# Patient Record
Sex: Female | Born: 1945
Health system: Southern US, Community
[De-identification: ages and names within clinical notes are randomized; demographics above are authoritative.]

## PROBLEM LIST (undated history)

## (undated) DIAGNOSIS — R413 Other amnesia: Secondary | ICD-10-CM

## (undated) DIAGNOSIS — I10 Essential (primary) hypertension: Secondary | ICD-10-CM

## (undated) DIAGNOSIS — H04123 Dry eye syndrome of bilateral lacrimal glands: Secondary | ICD-10-CM

## (undated) DIAGNOSIS — B0052 Herpesviral keratitis: Secondary | ICD-10-CM

## (undated) DIAGNOSIS — M8580 Other specified disorders of bone density and structure, unspecified site: Secondary | ICD-10-CM

## (undated) DIAGNOSIS — G5760 Lesion of plantar nerve, unspecified lower limb: Secondary | ICD-10-CM

## (undated) DIAGNOSIS — M1990 Unspecified osteoarthritis, unspecified site: Secondary | ICD-10-CM

## (undated) DIAGNOSIS — J302 Other seasonal allergic rhinitis: Secondary | ICD-10-CM

## (undated) DIAGNOSIS — M6740 Ganglion, unspecified site: Secondary | ICD-10-CM

## (undated) DIAGNOSIS — M5136 Other intervertebral disc degeneration, lumbar region: Secondary | ICD-10-CM

## (undated) DIAGNOSIS — G250 Essential tremor: Secondary | ICD-10-CM

## (undated) HISTORY — DX: Ganglion, unspecified site: M67.40

## (undated) HISTORY — DX: Herpesviral keratitis: B00.52

## (undated) HISTORY — DX: Other specified disorders of bone density and structure, unspecified site: M85.80

## (undated) HISTORY — DX: Essential tremor: G25.0

## (undated) HISTORY — DX: Other seasonal allergic rhinitis: J30.2

## (undated) HISTORY — DX: Essential (primary) hypertension: I10

## (undated) HISTORY — DX: Other amnesia: R41.3

## (undated) HISTORY — PX: OTHER SURGICAL HISTORY: SHX169

## (undated) HISTORY — DX: Unspecified osteoarthritis, unspecified site: M19.90

## (undated) HISTORY — DX: Lesion of plantar nerve, unspecified lower limb: G57.60

## (undated) HISTORY — DX: Other intervertebral disc degeneration, lumbar region: M51.36

## (undated) HISTORY — DX: Dry eye syndrome of bilateral lacrimal glands: H04.123

---

## 2013-01-25 ENCOUNTER — Other Ambulatory Visit: Payer: Self-pay

## 2013-01-25 DIAGNOSIS — Z1231 Encounter for screening mammogram for malignant neoplasm of breast: Secondary | ICD-10-CM

## 2013-02-14 ENCOUNTER — Other Ambulatory Visit: Payer: Self-pay | Admitting: Obstetrics and Gynecology

## 2013-02-14 ENCOUNTER — Other Ambulatory Visit (HOSPITAL_COMMUNITY)
Admission: RE | Admit: 2013-02-14 | Discharge: 2013-02-14 | Disposition: A | Discharge status: Home / Self Care | Payer: Managed Care, Other (non HMO) | Source: Ambulatory Visit | Attending: Obstetrics and Gynecology | Admitting: Obstetrics and Gynecology

## 2013-02-14 DIAGNOSIS — Z01419 Encounter for gynecological examination (general) (routine) without abnormal findings: Secondary | ICD-10-CM | POA: Insufficient documentation

## 2013-02-14 DIAGNOSIS — Z1151 Encounter for screening for human papillomavirus (HPV): Secondary | ICD-10-CM | POA: Insufficient documentation

## 2013-03-03 ENCOUNTER — Ambulatory Visit
Admission: RE | Admit: 2013-03-03 | Discharge: 2013-03-03 | Disposition: A | Discharge status: Home / Self Care | Payer: Managed Care, Other (non HMO) | Source: Ambulatory Visit

## 2013-03-03 DIAGNOSIS — Z1231 Encounter for screening mammogram for malignant neoplasm of breast: Secondary | ICD-10-CM

## 2014-01-26 ENCOUNTER — Other Ambulatory Visit: Payer: Self-pay

## 2014-01-26 DIAGNOSIS — Z1231 Encounter for screening mammogram for malignant neoplasm of breast: Secondary | ICD-10-CM

## 2014-03-07 ENCOUNTER — Ambulatory Visit: Payer: Managed Care, Other (non HMO)

## 2014-03-14 ENCOUNTER — Ambulatory Visit
Admission: RE | Admit: 2014-03-14 | Discharge: 2014-03-14 | Disposition: A | Discharge status: Home / Self Care | Payer: Medicare Other | Source: Ambulatory Visit

## 2014-03-14 ENCOUNTER — Encounter (INDEPENDENT_AMBULATORY_CARE_PROVIDER_SITE_OTHER): Payer: Self-pay

## 2014-03-14 DIAGNOSIS — Z1231 Encounter for screening mammogram for malignant neoplasm of breast: Secondary | ICD-10-CM

## 2015-02-15 ENCOUNTER — Other Ambulatory Visit: Payer: Self-pay

## 2015-02-15 DIAGNOSIS — Z1231 Encounter for screening mammogram for malignant neoplasm of breast: Secondary | ICD-10-CM

## 2015-03-20 ENCOUNTER — Ambulatory Visit
Admission: RE | Admit: 2015-03-20 | Discharge: 2015-03-20 | Disposition: A | Discharge status: Home / Self Care | Payer: Medicare Other | Source: Ambulatory Visit

## 2015-03-20 DIAGNOSIS — Z1231 Encounter for screening mammogram for malignant neoplasm of breast: Secondary | ICD-10-CM

## 2016-02-19 DIAGNOSIS — Z0000 Encounter for general adult medical examination without abnormal findings: Secondary | ICD-10-CM | POA: Diagnosis not present

## 2016-02-19 DIAGNOSIS — M67431 Ganglion, right wrist: Secondary | ICD-10-CM | POA: Diagnosis not present

## 2016-02-19 DIAGNOSIS — Z1389 Encounter for screening for other disorder: Secondary | ICD-10-CM | POA: Diagnosis not present

## 2016-02-19 DIAGNOSIS — R202 Paresthesia of skin: Secondary | ICD-10-CM | POA: Diagnosis not present

## 2016-02-19 DIAGNOSIS — I10 Essential (primary) hypertension: Secondary | ICD-10-CM | POA: Diagnosis not present

## 2016-02-21 ENCOUNTER — Other Ambulatory Visit: Payer: Self-pay | Admitting: Obstetrics and Gynecology

## 2016-02-21 ENCOUNTER — Other Ambulatory Visit (HOSPITAL_COMMUNITY)
Admission: RE | Admit: 2016-02-21 | Discharge: 2016-02-21 | Disposition: A | Discharge status: Home / Self Care | Payer: Medicare Other | Source: Ambulatory Visit | Attending: Obstetrics and Gynecology | Admitting: Obstetrics and Gynecology

## 2016-02-21 DIAGNOSIS — Z1151 Encounter for screening for human papillomavirus (HPV): Secondary | ICD-10-CM | POA: Diagnosis not present

## 2016-02-21 DIAGNOSIS — Z01411 Encounter for gynecological examination (general) (routine) with abnormal findings: Secondary | ICD-10-CM | POA: Diagnosis not present

## 2016-02-21 DIAGNOSIS — Z01419 Encounter for gynecological examination (general) (routine) without abnormal findings: Secondary | ICD-10-CM | POA: Diagnosis not present

## 2016-02-22 LAB — CYTOLOGY - PAP

## 2016-02-25 ENCOUNTER — Other Ambulatory Visit: Payer: Self-pay

## 2016-02-25 DIAGNOSIS — Z1231 Encounter for screening mammogram for malignant neoplasm of breast: Secondary | ICD-10-CM

## 2016-03-20 ENCOUNTER — Ambulatory Visit
Admission: RE | Admit: 2016-03-20 | Discharge: 2016-03-20 | Disposition: A | Discharge status: Home / Self Care | Payer: Medicare Other | Source: Ambulatory Visit

## 2016-03-20 DIAGNOSIS — Z1231 Encounter for screening mammogram for malignant neoplasm of breast: Secondary | ICD-10-CM | POA: Diagnosis not present

## 2016-07-07 DIAGNOSIS — H524 Presbyopia: Secondary | ICD-10-CM | POA: Diagnosis not present

## 2016-08-21 DIAGNOSIS — R2231 Localized swelling, mass and lump, right upper limb: Secondary | ICD-10-CM | POA: Diagnosis not present

## 2016-08-21 DIAGNOSIS — Z23 Encounter for immunization: Secondary | ICD-10-CM | POA: Diagnosis not present

## 2016-08-21 DIAGNOSIS — I10 Essential (primary) hypertension: Secondary | ICD-10-CM | POA: Diagnosis not present

## 2016-12-04 DIAGNOSIS — J0100 Acute maxillary sinusitis, unspecified: Secondary | ICD-10-CM | POA: Diagnosis not present

## 2016-12-25 DIAGNOSIS — L603 Nail dystrophy: Secondary | ICD-10-CM | POA: Diagnosis not present

## 2016-12-25 DIAGNOSIS — L570 Actinic keratosis: Secondary | ICD-10-CM | POA: Diagnosis not present

## 2016-12-25 DIAGNOSIS — L821 Other seborrheic keratosis: Secondary | ICD-10-CM | POA: Diagnosis not present

## 2016-12-25 DIAGNOSIS — D2260 Melanocytic nevi of unspecified upper limb, including shoulder: Secondary | ICD-10-CM | POA: Diagnosis not present

## 2016-12-25 DIAGNOSIS — D225 Melanocytic nevi of trunk: Secondary | ICD-10-CM | POA: Diagnosis not present

## 2016-12-25 DIAGNOSIS — Z85828 Personal history of other malignant neoplasm of skin: Secondary | ICD-10-CM | POA: Diagnosis not present

## 2016-12-25 DIAGNOSIS — D2270 Melanocytic nevi of unspecified lower limb, including hip: Secondary | ICD-10-CM | POA: Diagnosis not present

## 2017-01-01 DIAGNOSIS — M67431 Ganglion, right wrist: Secondary | ICD-10-CM | POA: Diagnosis not present

## 2017-01-05 DIAGNOSIS — H04123 Dry eye syndrome of bilateral lacrimal glands: Secondary | ICD-10-CM | POA: Diagnosis not present

## 2017-01-05 DIAGNOSIS — H16103 Unspecified superficial keratitis, bilateral: Secondary | ICD-10-CM | POA: Diagnosis not present

## 2017-01-05 DIAGNOSIS — H25813 Combined forms of age-related cataract, bilateral: Secondary | ICD-10-CM | POA: Diagnosis not present

## 2017-01-06 DIAGNOSIS — Z1211 Encounter for screening for malignant neoplasm of colon: Secondary | ICD-10-CM | POA: Diagnosis not present

## 2017-02-19 DIAGNOSIS — Z0000 Encounter for general adult medical examination without abnormal findings: Secondary | ICD-10-CM | POA: Diagnosis not present

## 2017-02-19 DIAGNOSIS — M67431 Ganglion, right wrist: Secondary | ICD-10-CM | POA: Diagnosis not present

## 2017-02-19 DIAGNOSIS — G250 Essential tremor: Secondary | ICD-10-CM | POA: Diagnosis not present

## 2017-02-19 DIAGNOSIS — I10 Essential (primary) hypertension: Secondary | ICD-10-CM | POA: Diagnosis not present

## 2017-03-04 ENCOUNTER — Other Ambulatory Visit: Payer: Self-pay | Admitting: Obstetrics and Gynecology

## 2017-03-04 DIAGNOSIS — Z1231 Encounter for screening mammogram for malignant neoplasm of breast: Secondary | ICD-10-CM

## 2017-03-11 DIAGNOSIS — N951 Menopausal and female climacteric states: Secondary | ICD-10-CM | POA: Diagnosis not present

## 2017-03-11 DIAGNOSIS — N898 Other specified noninflammatory disorders of vagina: Secondary | ICD-10-CM | POA: Diagnosis not present

## 2017-03-11 DIAGNOSIS — Z87410 Personal history of cervical dysplasia: Secondary | ICD-10-CM | POA: Diagnosis not present

## 2017-03-11 DIAGNOSIS — Z9189 Other specified personal risk factors, not elsewhere classified: Secondary | ICD-10-CM | POA: Diagnosis not present

## 2017-03-26 ENCOUNTER — Ambulatory Visit
Admission: RE | Admit: 2017-03-26 | Discharge: 2017-03-26 | Disposition: A | Discharge status: Home / Self Care | Payer: Medicare Other | Source: Ambulatory Visit | Attending: Obstetrics and Gynecology | Admitting: Obstetrics and Gynecology

## 2017-03-26 DIAGNOSIS — Z1231 Encounter for screening mammogram for malignant neoplasm of breast: Secondary | ICD-10-CM | POA: Diagnosis not present

## 2017-06-26 DIAGNOSIS — H04123 Dry eye syndrome of bilateral lacrimal glands: Secondary | ICD-10-CM | POA: Diagnosis not present

## 2017-06-26 DIAGNOSIS — H25813 Combined forms of age-related cataract, bilateral: Secondary | ICD-10-CM | POA: Diagnosis not present

## 2017-06-26 DIAGNOSIS — H16103 Unspecified superficial keratitis, bilateral: Secondary | ICD-10-CM | POA: Diagnosis not present

## 2017-10-09 DIAGNOSIS — Z23 Encounter for immunization: Secondary | ICD-10-CM | POA: Diagnosis not present

## 2017-12-29 DIAGNOSIS — H43812 Vitreous degeneration, left eye: Secondary | ICD-10-CM | POA: Diagnosis not present

## 2017-12-29 DIAGNOSIS — H25042 Posterior subcapsular polar age-related cataract, left eye: Secondary | ICD-10-CM | POA: Diagnosis not present

## 2018-02-10 DIAGNOSIS — H43813 Vitreous degeneration, bilateral: Secondary | ICD-10-CM | POA: Diagnosis not present

## 2018-02-10 DIAGNOSIS — H5704 Mydriasis: Secondary | ICD-10-CM | POA: Diagnosis not present

## 2018-02-10 DIAGNOSIS — H04123 Dry eye syndrome of bilateral lacrimal glands: Secondary | ICD-10-CM | POA: Diagnosis not present

## 2018-02-10 DIAGNOSIS — H25813 Combined forms of age-related cataract, bilateral: Secondary | ICD-10-CM | POA: Diagnosis not present

## 2018-02-19 DIAGNOSIS — Z1389 Encounter for screening for other disorder: Secondary | ICD-10-CM | POA: Diagnosis not present

## 2018-02-19 DIAGNOSIS — M8580 Other specified disorders of bone density and structure, unspecified site: Secondary | ICD-10-CM | POA: Diagnosis not present

## 2018-02-19 DIAGNOSIS — G250 Essential tremor: Secondary | ICD-10-CM | POA: Diagnosis not present

## 2018-02-19 DIAGNOSIS — I10 Essential (primary) hypertension: Secondary | ICD-10-CM | POA: Diagnosis not present

## 2018-03-02 DIAGNOSIS — H21562 Pupillary abnormality, left eye: Secondary | ICD-10-CM | POA: Diagnosis not present

## 2018-03-02 DIAGNOSIS — H268 Other specified cataract: Secondary | ICD-10-CM | POA: Diagnosis not present

## 2018-03-02 DIAGNOSIS — H25812 Combined forms of age-related cataract, left eye: Secondary | ICD-10-CM | POA: Diagnosis not present

## 2018-03-08 DIAGNOSIS — C44729 Squamous cell carcinoma of skin of left lower limb, including hip: Secondary | ICD-10-CM | POA: Diagnosis not present

## 2018-03-08 DIAGNOSIS — L821 Other seborrheic keratosis: Secondary | ICD-10-CM | POA: Diagnosis not present

## 2018-03-15 DIAGNOSIS — M810 Age-related osteoporosis without current pathological fracture: Secondary | ICD-10-CM | POA: Diagnosis not present

## 2018-03-15 DIAGNOSIS — Z01411 Encounter for gynecological examination (general) (routine) with abnormal findings: Secondary | ICD-10-CM | POA: Diagnosis not present

## 2018-06-29 DIAGNOSIS — H5704 Mydriasis: Secondary | ICD-10-CM | POA: Diagnosis not present

## 2018-06-29 DIAGNOSIS — H25811 Combined forms of age-related cataract, right eye: Secondary | ICD-10-CM | POA: Diagnosis not present

## 2018-08-04 DIAGNOSIS — Z961 Presence of intraocular lens: Secondary | ICD-10-CM | POA: Diagnosis not present

## 2018-09-01 DIAGNOSIS — Z23 Encounter for immunization: Secondary | ICD-10-CM | POA: Diagnosis not present

## 2018-09-01 DIAGNOSIS — R739 Hyperglycemia, unspecified: Secondary | ICD-10-CM | POA: Diagnosis not present

## 2019-02-02 DIAGNOSIS — Z961 Presence of intraocular lens: Secondary | ICD-10-CM | POA: Diagnosis not present

## 2019-02-02 DIAGNOSIS — H3500 Unspecified background retinopathy: Secondary | ICD-10-CM | POA: Diagnosis not present

## 2019-03-02 DIAGNOSIS — Z0000 Encounter for general adult medical examination without abnormal findings: Secondary | ICD-10-CM | POA: Diagnosis not present

## 2019-03-02 DIAGNOSIS — I10 Essential (primary) hypertension: Secondary | ICD-10-CM | POA: Diagnosis not present

## 2019-03-02 DIAGNOSIS — Z1389 Encounter for screening for other disorder: Secondary | ICD-10-CM | POA: Diagnosis not present

## 2019-09-16 DIAGNOSIS — G250 Essential tremor: Secondary | ICD-10-CM | POA: Diagnosis not present

## 2019-09-16 DIAGNOSIS — Z5181 Encounter for therapeutic drug level monitoring: Secondary | ICD-10-CM | POA: Diagnosis not present

## 2019-09-16 DIAGNOSIS — I10 Essential (primary) hypertension: Secondary | ICD-10-CM | POA: Diagnosis not present

## 2019-09-26 DIAGNOSIS — Z5181 Encounter for therapeutic drug level monitoring: Secondary | ICD-10-CM | POA: Diagnosis not present

## 2019-10-02 DIAGNOSIS — L03114 Cellulitis of left upper limb: Secondary | ICD-10-CM | POA: Diagnosis not present

## 2019-10-13 DIAGNOSIS — S6992XD Unspecified injury of left wrist, hand and finger(s), subsequent encounter: Secondary | ICD-10-CM | POA: Diagnosis not present

## 2019-10-26 ENCOUNTER — Other Ambulatory Visit: Payer: Self-pay | Admitting: Internal Medicine

## 2019-10-26 DIAGNOSIS — Z1231 Encounter for screening mammogram for malignant neoplasm of breast: Secondary | ICD-10-CM

## 2020-01-08 ENCOUNTER — Ambulatory Visit: Payer: Medicare Other

## 2020-07-20 ENCOUNTER — Other Ambulatory Visit: Payer: Self-pay | Admitting: Internal Medicine

## 2020-07-20 DIAGNOSIS — R413 Other amnesia: Secondary | ICD-10-CM | POA: Diagnosis not present

## 2020-08-09 DIAGNOSIS — S8001XA Contusion of right knee, initial encounter: Secondary | ICD-10-CM | POA: Diagnosis not present

## 2020-08-11 ENCOUNTER — Ambulatory Visit
Admission: RE | Admit: 2020-08-11 | Discharge: 2020-08-11 | Disposition: A | Discharge status: Home / Self Care | Payer: Medicare Other | Source: Ambulatory Visit | Attending: Internal Medicine | Admitting: Internal Medicine

## 2020-08-11 ENCOUNTER — Other Ambulatory Visit: Payer: Self-pay

## 2020-08-11 DIAGNOSIS — R413 Other amnesia: Secondary | ICD-10-CM | POA: Diagnosis not present

## 2020-08-11 DIAGNOSIS — R609 Edema, unspecified: Secondary | ICD-10-CM | POA: Diagnosis not present

## 2020-08-11 DIAGNOSIS — G930 Cerebral cysts: Secondary | ICD-10-CM | POA: Diagnosis not present

## 2020-08-11 DIAGNOSIS — I6782 Cerebral ischemia: Secondary | ICD-10-CM | POA: Diagnosis not present

## 2020-08-11 MED ORDER — GADOBENATE DIMEGLUMINE 529 MG/ML IV SOLN
13.0000 mL | Freq: Once | INTRAVENOUS | Status: AC | PRN
Start: 2020-08-11 — End: 2020-08-11
  Administered 2020-08-11: 13 mL via INTRAVENOUS

## 2020-09-18 DIAGNOSIS — E538 Deficiency of other specified B group vitamins: Secondary | ICD-10-CM | POA: Diagnosis not present

## 2020-11-08 ENCOUNTER — Ambulatory Visit: Payer: Medicare Other | Admitting: Neurology

## 2020-12-25 ENCOUNTER — Ambulatory Visit: Payer: Medicare Other | Admitting: Neurology

## 2020-12-27 DIAGNOSIS — I10 Essential (primary) hypertension: Secondary | ICD-10-CM | POA: Diagnosis not present

## 2020-12-27 DIAGNOSIS — M5432 Sciatica, left side: Secondary | ICD-10-CM | POA: Diagnosis not present

## 2021-01-03 ENCOUNTER — Ambulatory Visit (HOSPITAL_COMMUNITY)
Admission: RE | Admit: 2021-01-03 | Discharge: 2021-01-03 | Disposition: A | Discharge status: Home / Self Care | Payer: Medicare Other | Source: Ambulatory Visit | Attending: Internal Medicine | Admitting: Internal Medicine

## 2021-01-03 ENCOUNTER — Other Ambulatory Visit: Payer: Self-pay

## 2021-01-03 ENCOUNTER — Other Ambulatory Visit (HOSPITAL_COMMUNITY): Payer: Self-pay | Admitting: Internal Medicine

## 2021-01-03 ENCOUNTER — Other Ambulatory Visit: Payer: Self-pay | Admitting: Internal Medicine

## 2021-01-03 DIAGNOSIS — M4316 Spondylolisthesis, lumbar region: Secondary | ICD-10-CM | POA: Diagnosis not present

## 2021-01-03 DIAGNOSIS — M48061 Spinal stenosis, lumbar region without neurogenic claudication: Secondary | ICD-10-CM | POA: Diagnosis not present

## 2021-01-03 DIAGNOSIS — M5432 Sciatica, left side: Secondary | ICD-10-CM | POA: Diagnosis not present

## 2021-01-03 DIAGNOSIS — M47816 Spondylosis without myelopathy or radiculopathy, lumbar region: Secondary | ICD-10-CM | POA: Diagnosis not present

## 2021-01-03 DIAGNOSIS — M5126 Other intervertebral disc displacement, lumbar region: Secondary | ICD-10-CM | POA: Diagnosis not present

## 2021-01-07 ENCOUNTER — Other Ambulatory Visit: Payer: Self-pay | Admitting: Physician Assistant

## 2021-01-07 DIAGNOSIS — M5416 Radiculopathy, lumbar region: Secondary | ICD-10-CM | POA: Diagnosis not present

## 2021-01-11 ENCOUNTER — Ambulatory Visit
Admission: RE | Admit: 2021-01-11 | Discharge: 2021-01-11 | Disposition: A | Discharge status: Home / Self Care | Payer: Medicare Other | Source: Ambulatory Visit | Attending: Physician Assistant | Admitting: Physician Assistant

## 2021-01-11 DIAGNOSIS — M5416 Radiculopathy, lumbar region: Secondary | ICD-10-CM

## 2021-01-11 DIAGNOSIS — M47817 Spondylosis without myelopathy or radiculopathy, lumbosacral region: Secondary | ICD-10-CM | POA: Diagnosis not present

## 2021-01-11 MED ORDER — METHYLPREDNISOLONE ACETATE 40 MG/ML INJ SUSP (RADIOLOG
120.0000 mg | Freq: Once | INTRAMUSCULAR | Status: AC
  Administered 2021-01-11: 120 mg via EPIDURAL

## 2021-01-11 MED ORDER — IOPAMIDOL (ISOVUE-M 200) INJECTION 41%
1.0000 mL | Freq: Once | INTRAMUSCULAR | Status: AC
  Administered 2021-01-11: 1 mL via EPIDURAL

## 2021-01-11 NOTE — Discharge Instructions (Signed)

## 2021-01-16 ENCOUNTER — Other Ambulatory Visit: Payer: Self-pay | Admitting: Physician Assistant

## 2021-01-16 DIAGNOSIS — M5416 Radiculopathy, lumbar region: Secondary | ICD-10-CM

## 2021-01-21 DIAGNOSIS — M5416 Radiculopathy, lumbar region: Secondary | ICD-10-CM | POA: Diagnosis not present

## 2021-01-25 ENCOUNTER — Ambulatory Visit
Admission: RE | Admit: 2021-01-25 | Discharge: 2021-01-25 | Disposition: A | Discharge status: Home / Self Care | Payer: Medicare Other | Source: Ambulatory Visit | Attending: Physician Assistant | Admitting: Physician Assistant

## 2021-01-25 DIAGNOSIS — M5416 Radiculopathy, lumbar region: Secondary | ICD-10-CM

## 2021-01-25 DIAGNOSIS — M47817 Spondylosis without myelopathy or radiculopathy, lumbosacral region: Secondary | ICD-10-CM | POA: Diagnosis not present

## 2021-01-25 MED ORDER — IOPAMIDOL (ISOVUE-M 200) INJECTION 41%
1.0000 mL | Freq: Once | INTRAMUSCULAR | Status: AC
  Administered 2021-01-25: 1 mL via EPIDURAL

## 2021-01-25 MED ORDER — METHYLPREDNISOLONE ACETATE 40 MG/ML INJ SUSP (RADIOLOG
120.0000 mg | Freq: Once | INTRAMUSCULAR | Status: AC
  Administered 2021-01-25: 120 mg via EPIDURAL

## 2021-01-25 NOTE — Discharge Instructions (Signed)

## 2021-02-13 DIAGNOSIS — M5416 Radiculopathy, lumbar region: Secondary | ICD-10-CM | POA: Diagnosis not present

## 2021-02-14 ENCOUNTER — Other Ambulatory Visit: Payer: Self-pay | Admitting: Neurosurgery

## 2021-02-14 DIAGNOSIS — M5416 Radiculopathy, lumbar region: Secondary | ICD-10-CM

## 2021-02-15 ENCOUNTER — Other Ambulatory Visit: Payer: Self-pay

## 2021-02-15 ENCOUNTER — Ambulatory Visit
Admission: RE | Admit: 2021-02-15 | Discharge: 2021-02-15 | Disposition: A | Discharge status: Home / Self Care | Payer: Medicare Other | Source: Ambulatory Visit | Attending: Neurosurgery | Admitting: Neurosurgery

## 2021-02-15 DIAGNOSIS — M5416 Radiculopathy, lumbar region: Secondary | ICD-10-CM

## 2021-02-15 DIAGNOSIS — M5116 Intervertebral disc disorders with radiculopathy, lumbar region: Secondary | ICD-10-CM | POA: Diagnosis not present

## 2021-02-15 IMAGING — XA DG EPIDURAL NERVE ROOT
2 series · 2 of 2 positions shown · non-contrast
Comparison: none

CLINICAL DATA: Lumbar radiculopathy. Displacement the L3-4 and L4-5
lumbar discs.

[Series 1: ortho standard · 1 of 1 slices shown (1 of 2)]
[im 1/1]
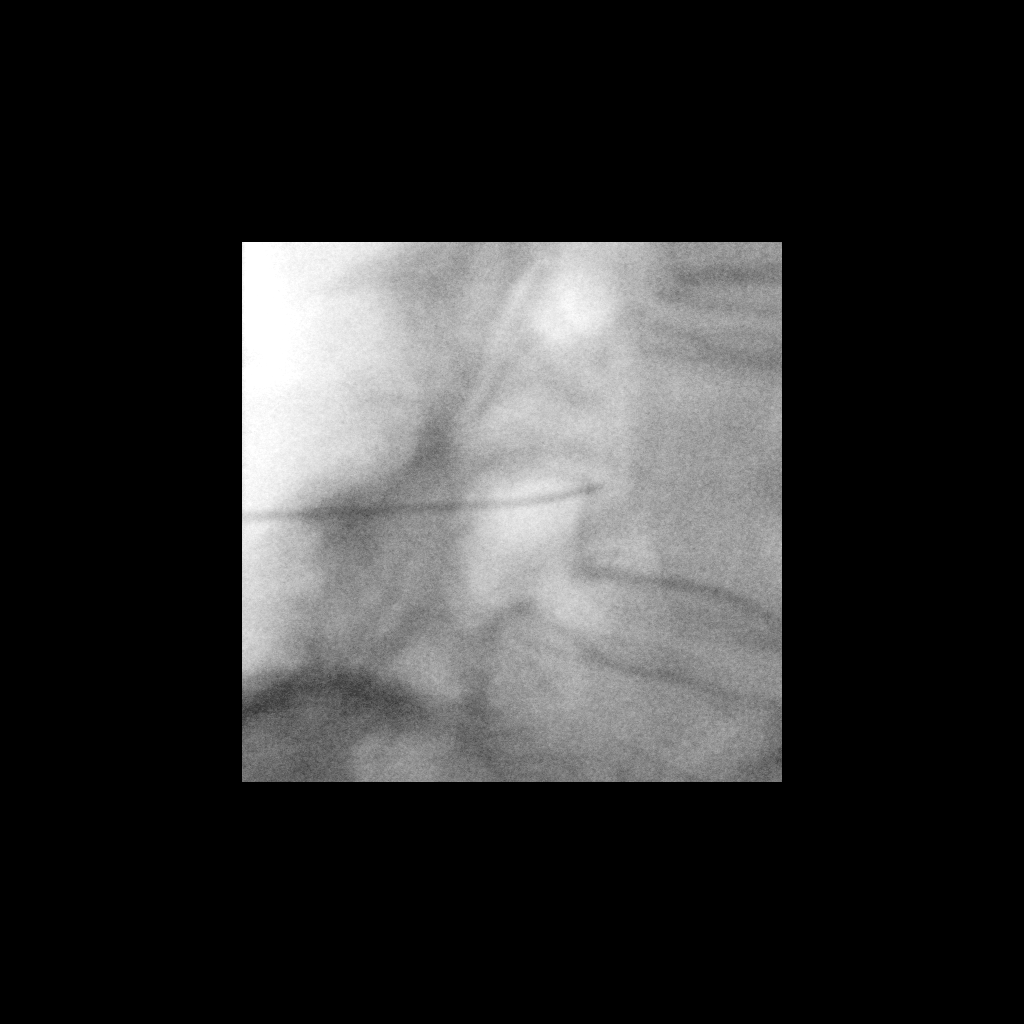

[Series 2: ortho standard · 1 of 1 slices shown (2 of 2)]
[im 1/1]
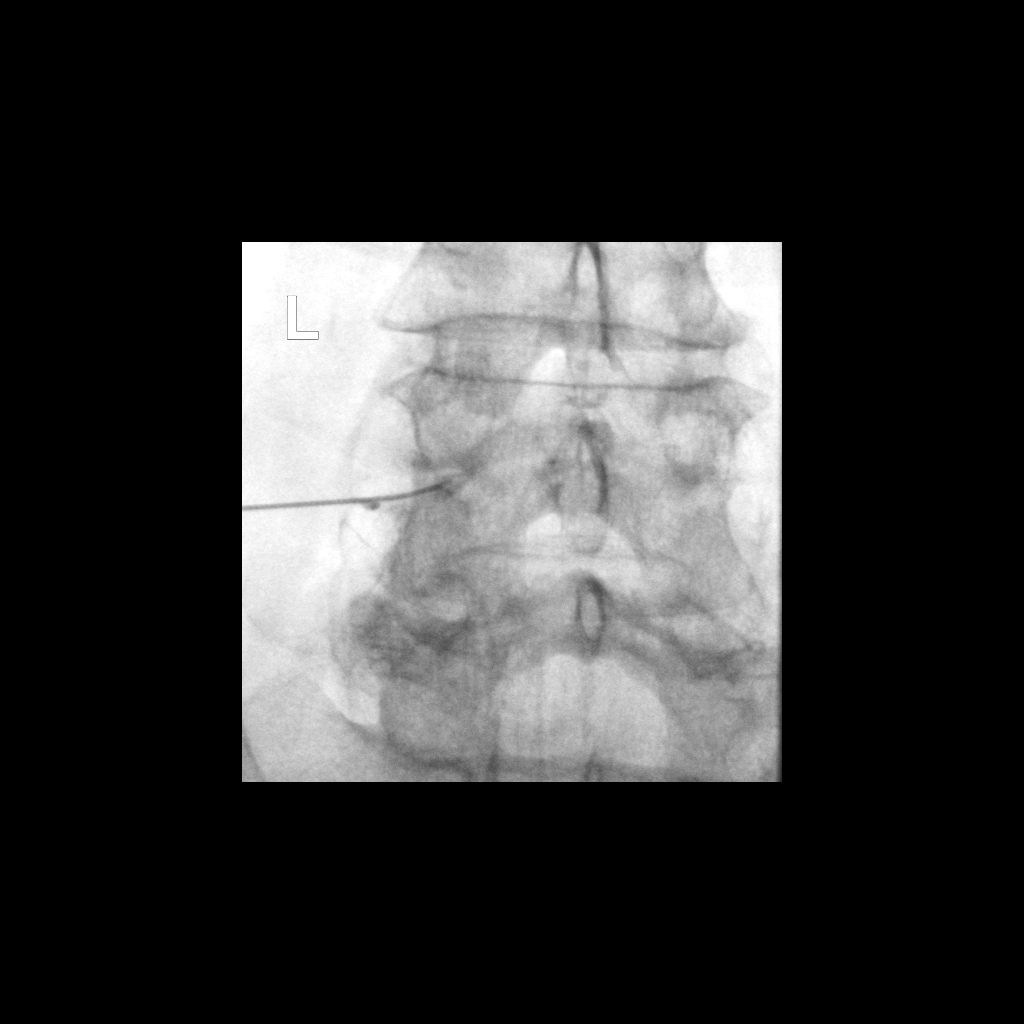

[2 of 2 positions shown; findings below may reference images not displayed]

FLUOROSCOPY TIME:  Radiation Exposure Index (as provided by the
fluoroscopic device): 14.91 uGy*m2

PROCEDURE:
SELECTIVE NERVE ROOT AND TRANSFORAMINAL EPIDURAL STEROID INJECTION:

A transforaminal approach was performed on the left at the L4-5
foramen. The overlying skin was cleansed and anesthetized. A 22
gauge spinal needle was advanced into the foramen from a lateral
oblique approach. Injection of 2cc of Omnipaque 180 outlined the
nerve root and extended into the epidural space. No vascular or
intrathecal spread is evident.

I then injected 120 mg of Depo-Medrol and 1.5 ml of 1% lidocaine.
The patient tolerated the procedure without evidence for
complication.

The patient was observed for 20 minutes prior to discharge in stable
neurologic condition.
IMPRESSION: Technically successful selective nerve root block and transforaminal
epidural steroid injection on theleft at the L4-5 foramen.

## 2021-02-15 MED ORDER — METHYLPREDNISOLONE ACETATE 40 MG/ML INJ SUSP (RADIOLOG
120.0000 mg | Freq: Once | INTRAMUSCULAR | Status: AC
  Administered 2021-02-15: 120 mg via EPIDURAL

## 2021-02-15 MED ORDER — IOPAMIDOL (ISOVUE-M 200) INJECTION 41%
1.0000 mL | Freq: Once | INTRAMUSCULAR | Status: AC
  Administered 2021-02-15: 1 mL via EPIDURAL

## 2021-02-15 NOTE — Discharge Instructions (Signed)

## 2021-05-02 ENCOUNTER — Encounter: Payer: Self-pay | Admitting: *Deleted

## 2021-05-07 ENCOUNTER — Ambulatory Visit: Payer: Medicare Other | Admitting: Neurology

## 2021-05-07 ENCOUNTER — Encounter: Payer: Self-pay | Admitting: Neurology

## 2021-05-07 VITALS — BP 112/73 | HR 73 | Ht 63.5 in | Wt 135.8 lb

## 2021-05-07 DIAGNOSIS — I679 Cerebrovascular disease, unspecified: Secondary | ICD-10-CM | POA: Insufficient documentation

## 2021-05-07 DIAGNOSIS — R4189 Other symptoms and signs involving cognitive functions and awareness: Secondary | ICD-10-CM | POA: Diagnosis not present

## 2021-05-07 MED ORDER — MEMANTINE HCL 10 MG PO TABS: 10.0000 mg | ORAL_TABLET | Freq: Two times a day (BID) | ORAL | 11 refills | Status: DC

## 2021-05-07 MED ORDER — DONEPEZIL HCL 10 MG PO TABS: 10.0000 mg | ORAL_TABLET | Freq: Every day | ORAL | 11 refills | Status: DC

## 2021-05-07 NOTE — Progress Notes (Signed)
Chief Complaint  Patient presents with   New Patient (Initial Visit)    MOCA: 21/30. She is here with her husband, Leonette Most. She has noticed a slow decline of memory (short-term more so than long-term). Hx of heavy alcohol use (now only have one glass of wine with dinner).      ASSESSMENT AND PLAN  Mary Arias is a 75 y.o. female   Cognitive impairment  MoCA examination 21/30  MRI of the brain September 2021 showed no acute abnormality, mild generalized atrophy, supratentorium small vessel disease  Laboratory evaluation in August 2021 showed no treatable etiology other than low normal range B12 224,  Mother, maternal grandfather suffered dementia,  Most consistent with central nervous system degenerative disorder, likely early stage of Alzheimer's disease  Emphasized importance of exercise, healthy lifestyle,  After discussed with patient and her husband, decided to proceed with Namenda 10 mg twice a day, 1 month later will add on Aricept 10 mg daily Cerebral small vessel disease  She has vascular risk factor of aging, hypertension, excessive alcohol intake  DIAGNOSTIC DATA (LABS, IMAGING, TESTING) - I reviewed patient records, labs, notes, testing and imaging myself where available.  Personally Reviewed MRI of brain w/wo on Sept 18 2021 No acute abnormality   Age-appropriate atrophy with mild chronic microvascular ischemic change in the white matter. 17 mm arachnoid cyst over the right convexity.   Lab result in August 2021:  TSH 2.48, CMP, creat 0.86, CBC, Hg 13.8, B12 224,  HISTORICAL  Mary Arias is a 75 year old female, seen in request by [facility] care physician Dr. Valentina Lucks, Jonny Ruiz for evaluation of memory loss, she is accompanied by her husband Billey Gosling at today's visit on May 07, 2021   I reviewed and summarized the referring note.  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  daily,   She is a retired , used to  work as a Emergency planning/management officer, strong family history of dementia, her mother, maternal grandfather suffered dementia,  She was noted to have gradual onset of memory loss since 2019, gradually getting worse, still pay bills, but making occasionally mistake now, also drives short distances, she rely on her husband to provide history during today's interview, husband reported that she call office check on her appointment yesterday, but totally forgot why she had an appointment today,  She denies lateralized motor or sensory deficit,  We personally reviewed MRI of brain with and without contrast in September 2022, no acute abnormality, mild small vessel disease  Laboratory evaluation in August 2021 showed normal CMP, CBC, TSH, low normal range B12 224  She had a history of smoking in the past, but quit many years ago, had tendency of excessive wine intake sometimes take more than 1 bottle in 1 day, her husband stopped buying wine for her few months ago, has improved    PHYSICAL EXAM:   Vitals:   05/07/21 1341  BP: 112/73  Pulse: 73  Weight: 135 lb 12.8 oz (61.6 kg)  Height: 5' 3.5" (1.613 m)   Not recorded     Body mass index is 23.68 kg/m.  PHYSICAL EXAMNIATION:  Gen: NAD, conversant, well nourised, well groomed                     Cardiovascular: Regular rate rhythm, no peripheral edema, warm, nontender. Eyes: Conjunctivae clear without exudates or hemorrhage Neck: Supple, no carotid bruits. Pulmonary: Clear to auscultation bilaterally   NEUROLOGICAL EXAM:  MENTAL STATUS: Speech:    Speech is normal; fluent and spontaneous with normal comprehension.  Cognition:      Montreal Cognitive Assessment  05/07/2021  Visuospatial/ Executive (0/5) 3  Naming (0/3) 3  Attention: Read list of digits (0/2) 2  Attention: Read list of letters (0/1) 1  Attention: Serial 7 subtraction starting at 100 (0/3) 3  Language: Repeat phrase (0/2) 1  Language : Fluency (0/1) 1  Abstraction  (0/2) 2  Delayed Recall (0/5) 0  Orientation (0/6) 5  Total 21  Adjusted Score (based on education) 21      CRANIAL NERVES: CN II: Visual fields are full to confrontation. Pupils are round equal and briskly reactive to light. CN III, IV, VI: extraocular movement are normal. No ptosis. CN V: Facial sensation is intact to light touch CN VII: Face is symmetric with normal eye closure  CN VIII: Hearing is normal to causal conversation. CN IX, X: Phonation is normal. CN XI: Head turning and shoulder shrug are intact  MOTOR: There is no pronator drift of out-stretched arms. Muscle bulk and tone are normal. Muscle strength is normal.  REFLEXES: Reflexes are 2+ and symmetric at the biceps, triceps, knees, and ankles. Plantar responses are flexor.  SENSORY: Intact to light touch, pinprick and vibratory sensation are intact in fingers and toes.  COORDINATION: There is no trunk or limb dysmetria noted.  GAIT/STANCE: Posture is normal. Gait is steady with normal steps, base, arm swing, and turning. Heel and toe walking are normal. Tandem gait is normal.  Romberg is absent.  REVIEW OF SYSTEMS:  Full 14 system review of systems performed and notable only for as above All other review of systems were negative.   ALLERGIES: Allergies  Allergen Reactions   Estrogens     Hair loss   Osphena [Ospemifene]     Headache, hot flashes    HOME MEDICATIONS: Current Outpatient Medications  Medication Sig Dispense Refill   acyclovir (ZOVIRAX) 200 MG capsule Take 200 mg by mouth daily.     atenolol (TENORMIN) 25 MG tablet Take 25 mg by mouth daily.     Biotin 47654 MCG TABS Take 1 tablet by mouth daily.     cholecalciferol (VITAMIN D3) 25 MCG (1000 UNIT) tablet Take 1,000 Units by mouth daily.     famotidine (PEPCID) 20 MG tablet Take 20 mg by mouth at bedtime.     Flaxseed, Linseed, (FLAX SEED OIL) 1000 MG CAPS Take 1 capsule by mouth daily.     lisinopril (ZESTRIL) 20 MG tablet Take 20 mg  by mouth daily.     loratadine (CLARITIN) 10 MG tablet Take 10 mg by mouth daily.     Multiple Minerals-Vitamins (CITRACAL MAXIMUM PLUS) TABS Take 1 tablet by mouth daily.     Multiple Vitamin (MULTIVITAMIN) tablet Take 1 tablet by mouth daily.     Propylene Glycol (SYSTANE COMPLETE OP) Apply 1 drop to eye 4 (four) times daily as needed.     No current facility-administered medications for this visit.    PAST MEDICAL HISTORY: Past Medical History:  Diagnosis Date   Bulging lumbar disc    DJD (degenerative joint disease)    hand   Dry eyes    Essential tremor    Ganglion cyst    Herpes keratitis    HTN (hypertension)    Memory loss    Morton's neuroma    Osteopenia    Seasonal allergies     PAST SURGICAL HISTORY: Past Surgical History:  Procedure Laterality Date   No past surgery.      FAMILY HISTORY: Family History  Problem Relation Age of Onset   Hypertension Mother    Dementia Mother    Tremor Father    Heart disease Father     SOCIAL HISTORY: Social History   Socioeconomic History   Marital status: Married    Spouse name: Not on file   Number of children: 2   Years of education: college   Highest education level: Bachelor's degree (e.g., BA, AB, BS)  Occupational History   Occupation: Retired  Tobacco Use   Smoking status: Former    Pack years: 0.00    Types: Cigarettes   Smokeless tobacco: Never  Substance and Sexual Activity   Alcohol use: Yes    Comment: Hx of heavy alcohol use. For the last 3 months, only having one glass of wine with dinner.   Drug use: Never   Sexual activity: Not on file  Other Topics Concern   Not on file  Social History Narrative   Lives with husband.   Right-handed.   Two cups caffeine daily.   Social Determinants of Health   Financial Resource Strain: Not on file  Food Insecurity: Not on file  Transportation Needs: Not on file  Physical Activity: Not on file  Stress: Not on file  Social Connections: Not on file   Intimate Partner Violence: Not on file    Total time spent reviewing the chart, obtaining history, examined patient, ordering tests, documentation, consultations and family, care coordination was 60 minutes    Levert Feinstein, M.D. Ph.D.  [facility] [address] [place], [place] [address] Ph: [phone] Fax: [phone]  CC:  Kirby Funk, MD [address] [place],  [place] [address]  Kirby Funk, MD

## 2021-05-07 NOTE — Patient Instructions (Addendum)
Meds ordered this encounter  Medications   donepezil (ARICEPT) 10 MG tablet    Sig: Take 1 tablet (10 mg total) by mouth at bedtime.    Dispense:  30 tablet    Refill:  11   memantine (NAMENDA) 10 MG tablet    Sig: Take 1 tablet (10 mg total) by mouth 2 (two) times daily.    Dispense:  60 tablet    Refill:  11      Over the counter Vit B12 1000 microgram daily

## 2021-10-14 DIAGNOSIS — C44529 Squamous cell carcinoma of skin of other part of trunk: Secondary | ICD-10-CM | POA: Diagnosis not present

## 2021-10-14 DIAGNOSIS — L821 Other seborrheic keratosis: Secondary | ICD-10-CM | POA: Diagnosis not present

## 2021-10-14 DIAGNOSIS — D492 Neoplasm of unspecified behavior of bone, soft tissue, and skin: Secondary | ICD-10-CM | POA: Diagnosis not present

## 2021-10-14 DIAGNOSIS — Z85828 Personal history of other malignant neoplasm of skin: Secondary | ICD-10-CM | POA: Diagnosis not present

## 2021-11-11 DIAGNOSIS — M8580 Other specified disorders of bone density and structure, unspecified site: Secondary | ICD-10-CM | POA: Diagnosis not present

## 2021-11-11 DIAGNOSIS — I10 Essential (primary) hypertension: Secondary | ICD-10-CM | POA: Diagnosis not present

## 2021-11-11 DIAGNOSIS — Z13220 Encounter for screening for lipoid disorders: Secondary | ICD-10-CM | POA: Diagnosis not present

## 2021-11-11 DIAGNOSIS — G5702 Lesion of sciatic nerve, left lower limb: Secondary | ICD-10-CM | POA: Diagnosis not present

## 2021-11-11 DIAGNOSIS — M47816 Spondylosis without myelopathy or radiculopathy, lumbar region: Secondary | ICD-10-CM | POA: Diagnosis not present

## 2021-11-12 DIAGNOSIS — C44529 Squamous cell carcinoma of skin of other part of trunk: Secondary | ICD-10-CM | POA: Diagnosis not present

## 2022-02-10 DIAGNOSIS — H5212 Myopia, left eye: Secondary | ICD-10-CM | POA: Diagnosis not present

## 2022-02-24 DIAGNOSIS — H26492 Other secondary cataract, left eye: Secondary | ICD-10-CM | POA: Diagnosis not present

## 2022-03-10 DIAGNOSIS — H26491 Other secondary cataract, right eye: Secondary | ICD-10-CM | POA: Diagnosis not present

## 2022-05-06 NOTE — Progress Notes (Deleted)
Patient: Mary Arias Date of Birth: 02-01-1946  Reason for Visit: Follow up History from: Patient Primary Neurologist:    ASSESSMENT AND PLAN 76 y.o. year old female   1.  Cognitive impairment 2.  Cerebral small vessel disease -MOCA was -MRI of the brain September 2021 showed no acute abnormality, mild generalized atrophy, supratentorium small vessel disease -Laboratory evaluation 2021 low normal B12 224 -Family history of dementia -Her condition is most consistent with central nervous system degenerative disorder, likely early stages of Alzheimer's disease -Vascular risk factors of aging, hypertension, excessive alcohol intake  HISTORY  Mary Arias is a 76 year old female, seen in [facility] by [facility] care physician Dr. Valentina Lucks, Jonny Ruiz for evaluation of memory loss, she is accompanied by her husband Billey Gosling at today's visit on May 07, 2021     I reviewed and summarized the referring note.  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  daily,    She is a retired , used to work as a Emergency planning/management officer, strong family history of dementia, her mother, maternal grandfather suffered dementia,   She was noted to have gradual onset of memory loss since 2019, gradually getting worse, still pay bills, but making occasionally mistake now, also drives short distances, she rely on her husband to provide history during today's interview, husband reported that she call office check on her appointment yesterday, but totally forgot why she had an appointment today,  She denies lateralized motor or sensory deficit,  We personally reviewed MRI of brain with and without contrast in September 2022, no acute abnormality, mild small vessel disease  Laboratory evaluation in August 2021 showed normal CMP, CBC, TSH, low normal range B12 224  She had a history of smoking in the past, but quit many years ago, had tendency of excessive wine intake  sometimes take more than 1 bottle in 1 day, her husband stopped buying wine for her few months ago, has improved   Update May 07, 2022 SS:   REVIEW OF SYSTEMS: Out of a complete 14 system review of symptoms, the patient complains only of the following symptoms, and all other reviewed systems are negative.  See HPI  ALLERGIES: Allergies  Allergen Reactions   Estrogens     Hair loss   Osphena [Ospemifene]     Headache, hot flashes    HOME MEDICATIONS: Outpatient Medications Prior to Visit  Medication Sig Dispense Refill   acyclovir (ZOVIRAX) 200 MG capsule Take 200 mg by mouth daily.     atenolol (TENORMIN) 25 MG tablet Take 25 mg by mouth daily.     Biotin May 09, 2022 MCG TABS Take 1 tablet by mouth daily.     cholecalciferol (VITAMIN D3) 25 MCG (1000 UNIT) tablet Take 1,000 Units by mouth daily.     donepezil (ARICEPT) 10 MG tablet Take 1 tablet (10 mg total) by mouth at bedtime. 30 tablet 11   famotidine (PEPCID) 20 MG tablet Take 20 mg by mouth at bedtime.     Flaxseed, Linseed, (FLAX SEED OIL) 1000 MG CAPS Take 1 capsule by mouth daily.     lisinopril (ZESTRIL) 20 MG tablet Take 20 mg by mouth daily.     loratadine (CLARITIN) 10 MG tablet Take 10 mg by mouth daily.     memantine (NAMENDA) 10 MG tablet Take 1 tablet (10 mg total) by mouth 2 (two) times daily. 60 tablet 11   Multiple Minerals-Vitamins (CITRACAL MAXIMUM PLUS) TABS Take 1 tablet by  mouth daily.     Multiple Vitamin (MULTIVITAMIN) tablet Take 1 tablet by mouth daily.     Propylene Glycol (SYSTANE COMPLETE OP) Apply 1 drop to eye 4 (four) times daily as needed.     No facility-administered medications prior to visit.    PAST MEDICAL HISTORY: Past Medical History:  Diagnosis Date   Bulging lumbar disc    DJD (degenerative joint disease)    hand   Dry eyes    Essential tremor    Ganglion cyst    Herpes keratitis    HTN (hypertension)    Memory loss    Morton's neuroma    Osteopenia    Seasonal allergies      PAST SURGICAL HISTORY: Past Surgical History:  Procedure Laterality Date   No past surgery.      FAMILY HISTORY: Family History  Problem Relation Age of Onset   Hypertension Mother    Dementia Mother    Tremor Father    Heart disease Father     SOCIAL HISTORY: Social History   Socioeconomic History   Marital status: Married    Spouse name: Not on file   Number of children: 2   Years of education: college   Highest education level: Bachelor's degree (e.g., BA, AB, BS)  Occupational History   Occupation: Retired  Tobacco Use   Smoking status: Former    Types: Cigarettes   Smokeless tobacco: Never  Substance and Sexual Activity   Alcohol use: Yes    Comment: Hx of heavy alcohol use. For the last 3 months, only having one glass of wine with dinner.   Drug use: Never   Sexual activity: Not on file  Other Topics Concern   Not on file  Social History Narrative   Lives with husband.   Right-handed.   Two cups caffeine daily.   Social Determinants of Health   Financial Resource Strain: Not on file  Food Insecurity: Not on file  Transportation Needs: Not on file  Physical Activity: Not on file  Stress: Not on file  Social Connections: Not on file  Intimate Partner Violence: Not on file    PHYSICAL EXAM  There were no vitals filed for this visit. There is no height or weight on file to calculate BMI.  Generalized: Well developed, in no acute distress  Neurological examination  Mentation: Alert oriented to time, place, history taking. Follows all commands speech and language fluent Cranial nerve II-XII: Pupils were equal round reactive to light. Extraocular movements were full, visual field were full on confrontational test. Facial sensation and strength were normal. Uvula tongue midline. Head turning and shoulder shrug  were normal and symmetric. Motor: The motor testing reveals 5 over 5 strength of all 4 extremities. Good symmetric motor tone is noted  throughout.  Sensory: Sensory testing is intact to soft touch on all 4 extremities. No evidence of extinction is noted.  Coordination: Cerebellar testing reveals good finger-nose-finger and heel-to-shin bilaterally.  Gait and station: Gait is normal. Tandem gait is normal. Romberg is negative. No drift is seen.  Reflexes: Deep tendon reflexes are symmetric and normal bilaterally.   DIAGNOSTIC DATA (LABS, IMAGING, TESTING) - I reviewed patient records, labs, notes, testing and imaging myself where available.  No results found for: "WBC", "HGB", "HCT", "MCV", "PLT" No results found for: "NA", "K", "CL", "CO2", "GLUCOSE", "BUN", "CREATININE", "CALCIUM", "PROT", "ALBUMIN", "AST", "ALT", "ALKPHOS", "BILITOT", "GFRNONAA", "GFRAA" No results found for: "CHOL", "HDL", "LDLCALC", "LDLDIRECT", "TRIG", "CHOLHDL" No results found for: "  HGBA1C" No results found for: "VITAMINB12" No results found for: "TSH"  Margie EgeSarah Neils Siracusa, AGNP-C, DNP 05/06/2022, 3:26 PM [facility] [address], [address] [place], [place] [id number] [phone]

## 2022-05-07 ENCOUNTER — Ambulatory Visit: Payer: Medicare Other | Admitting: Neurology

## 2022-05-07 ENCOUNTER — Telehealth: Payer: Self-pay | Admitting: Neurology

## 2022-05-07 NOTE — Telephone Encounter (Signed)
Pt cancelled appt due to out of town

## 2022-05-13 DIAGNOSIS — I10 Essential (primary) hypertension: Secondary | ICD-10-CM | POA: Diagnosis not present

## 2022-05-13 DIAGNOSIS — F02A0 Dementia in other diseases classified elsewhere, mild, without behavioral disturbance, psychotic disturbance, mood disturbance, and anxiety: Secondary | ICD-10-CM | POA: Diagnosis not present

## 2022-05-13 DIAGNOSIS — G301 Alzheimer's disease with late onset: Secondary | ICD-10-CM | POA: Diagnosis not present

## 2022-05-13 DIAGNOSIS — M47816 Spondylosis without myelopathy or radiculopathy, lumbar region: Secondary | ICD-10-CM | POA: Diagnosis not present

## 2022-06-16 ENCOUNTER — Other Ambulatory Visit: Payer: Self-pay | Admitting: Neurology

## 2022-08-09 DIAGNOSIS — H1032 Unspecified acute conjunctivitis, left eye: Secondary | ICD-10-CM | POA: Diagnosis not present

## 2022-08-11 DIAGNOSIS — H02054 Trichiasis without entropian left upper eyelid: Secondary | ICD-10-CM | POA: Diagnosis not present

## 2022-09-09 ENCOUNTER — Ambulatory Visit: Payer: Medicare Other | Admitting: Neurology

## 2022-09-09 ENCOUNTER — Encounter: Payer: Self-pay | Admitting: Neurology

## 2022-09-09 VITALS — BP 144/83 | HR 54 | Ht 63.5 in | Wt 130.0 lb

## 2022-09-09 DIAGNOSIS — I679 Cerebrovascular disease, unspecified: Secondary | ICD-10-CM | POA: Diagnosis not present

## 2022-09-09 DIAGNOSIS — R4189 Other symptoms and signs involving cognitive functions and awareness: Secondary | ICD-10-CM | POA: Diagnosis not present

## 2022-09-09 MED ORDER — TRAZODONE HCL 50 MG PO TABS: 50.0000 mg | ORAL_TABLET | Freq: Every day | ORAL | 11 refills | Status: DC

## 2022-09-09 MED ORDER — DONEPEZIL HCL 10 MG PO TABS: 10.0000 mg | ORAL_TABLET | Freq: Every day | ORAL | 3 refills | Status: DC

## 2022-09-09 MED ORDER — MEMANTINE HCL 10 MG PO TABS: 10.0000 mg | ORAL_TABLET | Freq: Two times a day (BID) | ORAL | 3 refills | Status: DC

## 2022-09-09 NOTE — Progress Notes (Signed)
Chief Complaint  Patient presents with   Follow-up    Rm 15. Accompanied by husband. Pt states memory is stable. Moca 22/30.      ASSESSMENT AND PLAN  Mary Arias is a 76 y.o. female   Cognitive impairment  MoCA examination 22/30  MRI of the brain September 2021 showed no acute abnormality, mild generalized atrophy, supratentorium small vessel disease  Mother, maternal grandfather suffered dementia,  Most consistent with central nervous system degenerative disorder, likely early stage of Alzheimer's disease  Emphasized importance of exercise, healthy lifestyle,  Has been tolerating Namenda 10 mg twice a day, Aricept 10 mg daily Cerebral small vessel disease  She has vascular risk factor of aging, hypertension, history of excessive alcohol intake, previous smoker Chronic insomnia  Add on Trazodone 50mg  qhs   DIAGNOSTIC DATA (LABS, IMAGING, TESTING) - I reviewed patient records, labs, notes, testing and imaging myself where available.  Personally Reviewed MRI of brain w/wo on Sept 18 2021 No acute abnormality   Age-appropriate atrophy with mild chronic microvascular ischemic change in the white matter. 17 mm arachnoid cyst over the right convexity.   Lab result in August 2021:  TSH 2.48, CMP, creat 0.86, CBC, Hg 13.8, B12 224,   HISTORICAL  Mary Arias is a 76 year old female, seen in request by [facility] care physician Dr. Laurann Montana, Jenny Reichmann for evaluation of memory loss, she is accompanied by her husband Eduard Clos at today's visit on May 07, 2021   I reviewed and summarized the referring note.  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  daily,   She is a retired Government social research officer, used to work as a Dance movement psychotherapist, strong family history of dementia, her mother, maternal grandfather suffered dementia,  She was noted to have gradual onset of memory loss since 2019, gradually getting worse, still pay bills, but making occasionally  mistake now, also drives short distances, she rely on her husband to provide history during today's interview, husband reported that she call office check on her appointment yesterday, but totally forgot why she had an appointment today,  She denies lateralized motor or sensory deficit,  We personally reviewed MRI of brain with and without contrast in September 2022, no acute abnormality, mild small vessel disease  Laboratory evaluation in August 2021 showed normal CMP, CBC, TSH, low normal range B12 224  She had a history of smoking in the past, but quit many years ago, had tendency of excessive wine intake sometimes take more than 1 bottle in 1 day, her husband stopped buying wine for her few months ago, has improved   UPDATE Sep 09 2022: She is accompanied by her husband at today's clinical visit, remain functional at her daily activity, MoCA examination 22/30, main concern is not sleeping well at nighttime, 4 out of 7 days she complains of poor overnight sleep, increased brain fog at day time, denies agitation    PHYSICAL EXAM:   Vitals:   09/09/22 1457  BP: (!) 144/83  Pulse: (!) 54  Weight: 130 lb (59 kg)  Height: 5' 3.5" (1.613 m)     Body mass index is 22.67 kg/m.  PHYSICAL EXAMNIATION:  Gen: NAD, conversant, well nourised, well groomed                     Cardiovascular: Regular rate rhythm, no peripheral edema, warm, nontender. Eyes: Conjunctivae clear without exudates or hemorrhage Neck: Supple, no carotid bruits. Pulmonary: Clear to auscultation bilaterally  NEUROLOGICAL EXAM:  MENTAL STATUS: Speech:    Speech is normal; fluent and spontaneous with normal comprehension.  Cognition:         09/09/2022    2:46 PM 05/07/2021    1:58 PM  Montreal Cognitive Assessment   Visuospatial/ Executive (0/5) 4 3  Naming (0/3) 3 3  Attention: Read list of digits (0/2) 2 2  Attention: Read list of letters (0/1) 1 1  Attention: Serial 7 subtraction starting at 100 (0/3)  3 3  Language: Repeat phrase (0/2) 2 1  Language : Fluency (0/1) 0 1  Abstraction (0/2) 2 2  Delayed Recall (0/5) 0 0  Orientation (0/6) 5 5  Total 22 21  Adjusted Score (based on education) 22 21      CRANIAL NERVES: CN II: Visual fields are full to confrontation. Pupils are round equal and briskly reactive to light. CN III, IV, VI: extraocular movement are normal. No ptosis. CN V: Facial sensation is intact to light touch CN VII: Face is symmetric with normal eye closure  CN VIII: Hearing is normal to causal conversation. CN IX, X: Phonation is normal. CN XI: Head turning and shoulder shrug are intact  MOTOR: There is no pronator drift of out-stretched arms. Muscle bulk and tone are normal. Muscle strength is normal.  REFLEXES: Reflexes are 2+ and symmetric at the biceps, triceps, knees, and ankles. Plantar responses are flexor.  SENSORY: Intact to light touch, pinprick and vibratory sensation are intact in fingers and toes.  COORDINATION: There is no trunk or limb dysmetria noted.  GAIT/STANCE: Posture is normal. Gait is steady with normal steps, base, arm swing, and turning. Heel and toe walking are normal. Tandem gait is normal.  Romberg is absent.  REVIEW OF SYSTEMS:  Full 14 system review of systems performed and notable only for as above All other review of systems were negative.   ALLERGIES: Allergies  Allergen Reactions   Estrogens     Hair loss   Osphena [Ospemifene]     Headache, hot flashes    HOME MEDICATIONS: Current Outpatient Medications  Medication Sig Dispense Refill   acyclovir (ZOVIRAX) 200 MG capsule Take 200 mg by mouth daily.     atenolol (TENORMIN) 25 MG tablet Take 25 mg by mouth daily.     Biotin 10000 MCG TABS Take 1 tablet by mouth daily.     cholecalciferol (VITAMIN D3) 25 MCG (1000 UNIT) tablet Take 1,000 Units by mouth daily.     donepezil (ARICEPT) 10 MG tablet TAKE 1 TABLET BY MOUTH AT BEDTIME 90 tablet 0   famotidine (PEPCID)  20 MG tablet Take 20 mg by mouth at bedtime.     Flaxseed, Linseed, (FLAX SEED OIL) 1000 MG CAPS Take 1 capsule by mouth daily.     memantine (NAMENDA) 10 MG tablet Take 1 tablet by mouth twice daily 180 tablet 0   Multiple Minerals-Vitamins (CITRACAL MAXIMUM PLUS) TABS Take 1 tablet by mouth daily.     Multiple Vitamin (MULTIVITAMIN) tablet Take 1 tablet by mouth daily.     No current facility-administered medications for this visit.    PAST MEDICAL HISTORY: Past Medical History:  Diagnosis Date   Bulging lumbar disc    DJD (degenerative joint disease)    hand   Dry eyes    Essential tremor    Ganglion cyst    Herpes keratitis    HTN (hypertension)    Memory loss    Morton's neuroma    Osteopenia  Seasonal allergies     PAST SURGICAL HISTORY: Past Surgical History:  Procedure Laterality Date   No past surgery.      FAMILY HISTORY: Family History  Problem Relation Age of Onset   Hypertension Mother    Dementia Mother    Tremor Father    Heart disease Father     SOCIAL HISTORY: Social History   Socioeconomic History   Marital status: Married    Spouse name: Not on file   Number of children: 2   Years of education: college   Highest education level: Bachelor's degree (e.g., BA, AB, BS)  Occupational History   Occupation: Retired  Tobacco Use   Smoking status: Former    Types: Cigarettes   Smokeless tobacco: Never  Substance and Sexual Activity   Alcohol use: Yes    Comment: Hx of heavy alcohol use. For the last 3 months, only having one glass of wine with dinner.   Drug use: Never   Sexual activity: Not on file  Other Topics Concern   Not on file  Social History Narrative   Lives with husband.   Right-handed.   Two cups caffeine daily.   Social Determinants of Health   Financial Resource Strain: Not on file  Food Insecurity: Not on file  Transportation Needs: Not on file  Physical Activity: Not on file  Stress: Not on file  Social  Connections: Not on file  Intimate Partner Violence: Not on file        Marcial Pacas, M.D. Ph.D.  [facility] [address], [place], [place] [address] Ph: [phone] Fax: [phone]  CC:  Lavone Orn, MD No address on file  Kathalene Frames, MD

## 2022-09-28 DIAGNOSIS — H1031 Unspecified acute conjunctivitis, right eye: Secondary | ICD-10-CM | POA: Diagnosis not present

## 2022-10-20 DIAGNOSIS — H0012 Chalazion right lower eyelid: Secondary | ICD-10-CM | POA: Diagnosis not present

## 2022-12-02 DIAGNOSIS — H0012 Chalazion right lower eyelid: Secondary | ICD-10-CM | POA: Diagnosis not present

## 2022-12-04 DIAGNOSIS — K080 Exfoliation of teeth due to systemic causes: Secondary | ICD-10-CM | POA: Diagnosis not present

## 2022-12-19 DIAGNOSIS — M47816 Spondylosis without myelopathy or radiculopathy, lumbar region: Secondary | ICD-10-CM | POA: Diagnosis not present

## 2022-12-19 DIAGNOSIS — M8580 Other specified disorders of bone density and structure, unspecified site: Secondary | ICD-10-CM | POA: Diagnosis not present

## 2022-12-19 DIAGNOSIS — Z1331 Encounter for screening for depression: Secondary | ICD-10-CM | POA: Diagnosis not present

## 2022-12-19 DIAGNOSIS — Z0000 Encounter for general adult medical examination without abnormal findings: Secondary | ICD-10-CM | POA: Diagnosis not present

## 2022-12-19 DIAGNOSIS — Z13220 Encounter for screening for lipoid disorders: Secondary | ICD-10-CM | POA: Diagnosis not present

## 2022-12-19 DIAGNOSIS — I10 Essential (primary) hypertension: Secondary | ICD-10-CM | POA: Diagnosis not present

## 2022-12-19 DIAGNOSIS — G5702 Lesion of sciatic nerve, left lower limb: Secondary | ICD-10-CM | POA: Diagnosis not present

## 2022-12-23 ENCOUNTER — Other Ambulatory Visit: Payer: Self-pay | Admitting: Internal Medicine

## 2022-12-23 DIAGNOSIS — H0012 Chalazion right lower eyelid: Secondary | ICD-10-CM | POA: Diagnosis not present

## 2022-12-23 DIAGNOSIS — M8580 Other specified disorders of bone density and structure, unspecified site: Secondary | ICD-10-CM

## 2023-01-21 ENCOUNTER — Ambulatory Visit
Admission: RE | Admit: 2023-01-21 | Discharge: 2023-01-21 | Disposition: A | Discharge status: Home / Self Care | Payer: Medicare Other | Source: Ambulatory Visit | Attending: Internal Medicine | Admitting: Internal Medicine

## 2023-01-21 DIAGNOSIS — Z780 Asymptomatic menopausal state: Secondary | ICD-10-CM | POA: Diagnosis not present

## 2023-01-21 DIAGNOSIS — M8589 Other specified disorders of bone density and structure, multiple sites: Secondary | ICD-10-CM | POA: Diagnosis not present

## 2023-01-21 DIAGNOSIS — M8580 Other specified disorders of bone density and structure, unspecified site: Secondary | ICD-10-CM

## 2023-01-26 DIAGNOSIS — H04123 Dry eye syndrome of bilateral lacrimal glands: Secondary | ICD-10-CM | POA: Diagnosis not present

## 2023-03-12 DIAGNOSIS — L719 Rosacea, unspecified: Secondary | ICD-10-CM | POA: Diagnosis not present

## 2023-03-12 DIAGNOSIS — Z85828 Personal history of other malignant neoplasm of skin: Secondary | ICD-10-CM | POA: Diagnosis not present

## 2023-04-27 DIAGNOSIS — L719 Rosacea, unspecified: Secondary | ICD-10-CM | POA: Diagnosis not present

## 2023-05-25 DIAGNOSIS — H00012 Hordeolum externum right lower eyelid: Secondary | ICD-10-CM | POA: Diagnosis not present

## 2023-07-01 DIAGNOSIS — F02A0 Dementia in other diseases classified elsewhere, mild, without behavioral disturbance, psychotic disturbance, mood disturbance, and anxiety: Secondary | ICD-10-CM | POA: Diagnosis not present

## 2023-07-01 DIAGNOSIS — E7800 Pure hypercholesterolemia, unspecified: Secondary | ICD-10-CM | POA: Diagnosis not present

## 2023-07-01 DIAGNOSIS — I10 Essential (primary) hypertension: Secondary | ICD-10-CM | POA: Diagnosis not present

## 2023-07-01 DIAGNOSIS — G301 Alzheimer's disease with late onset: Secondary | ICD-10-CM | POA: Diagnosis not present

## 2023-07-29 DIAGNOSIS — H0012 Chalazion right lower eyelid: Secondary | ICD-10-CM | POA: Diagnosis not present

## 2023-07-30 DIAGNOSIS — H0012 Chalazion right lower eyelid: Secondary | ICD-10-CM | POA: Diagnosis not present

## 2023-08-24 NOTE — Progress Notes (Unsigned)
No chief complaint on file.     ASSESSMENT AND PLAN  Mary Arias is a 77 y.o. female   Cognitive impairment  MoCA examination 22/30  MRI of the brain September 2021 showed no acute abnormality, mild generalized atrophy, supratentorium small vessel disease  Mother, maternal grandfather suffered dementia,  Most consistent with central nervous system degenerative disorder, likely early stage of Alzheimer's disease  Emphasized importance of exercise, healthy lifestyle,  Has been tolerating Namenda 10 mg twice a day, Aricept 10 mg daily Cerebral small vessel disease  She has vascular risk factor of aging, hypertension, history of excessive alcohol intake, previous smoker Chronic insomnia  Add on Trazodone 50mg  qhs   DIAGNOSTIC DATA (LABS, IMAGING, TESTING) - I reviewed patient records, labs, notes, testing and imaging myself where available.  Personally Reviewed MRI of brain w/wo on Sept 18 2021 No acute abnormality   Age-appropriate atrophy with mild chronic microvascular ischemic change in the white matter. 17 mm arachnoid cyst over the right convexity.   Lab result in August 2021:  TSH 2.48, CMP, creat 0.86, CBC, Hg 13.8, B12 224,   HISTORICAL  Mary Arias is a 77 year old female, seen in request by [facility] care physician Dr. Valentina Lucks, Jonny Ruiz for evaluation of memory loss, she is accompanied by her husband Billey Gosling at today's visit on May 07, 2021   I reviewed and summarized the referring note.  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  daily,   She is a retired Emergency planning/management officer, used to work as a Quarry manager, strong family history of dementia, her mother, maternal grandfather suffered dementia,  She was noted to have gradual onset of memory loss since 2019, gradually getting worse, still pay bills, but making occasionally mistake now, also drives short distances, she rely on her husband to provide history during today's  interview, husband reported that she call office check on her appointment yesterday, but totally forgot why she had an appointment today,  She denies lateralized motor or sensory deficit,  We personally reviewed MRI of brain with and without contrast in September 2022, no acute abnormality, mild small vessel disease  Laboratory evaluation in August 2021 showed normal CMP, CBC, TSH, low normal range B12 224  She had a history of smoking in the past, but quit many years ago, had tendency of excessive wine intake sometimes take more than 1 bottle in 1 day, her husband stopped buying wine for her few months ago, has improved   UPDATE Sep 09 2022: She is accompanied by her husband at today's clinical visit, remain functional at her daily activity, MoCA examination 22/30, main concern is not sleeping well at nighttime, 4 out of 7 days she complains of poor overnight sleep, increased brain fog at day time, denies agitation    Update August 25, 2023 SS:   PHYSICAL EXAM:   There were no vitals filed for this visit.    There is no height or weight on file to calculate BMI.  PHYSICAL EXAMNIATION:  Gen: NAD, conversant, well nourised, well groomed                     Cardiovascular: Regular rate rhythm, no peripheral edema, warm, nontender. Eyes: Conjunctivae clear without exudates or hemorrhage Neck: Supple, no carotid bruits. Pulmonary: Clear to auscultation bilaterally   NEUROLOGICAL EXAM:  MENTAL STATUS: Speech:    Speech is normal; fluent and spontaneous with normal comprehension.  Cognition:  09/09/2022    2:46 PM 05/07/2021    1:58 PM  Montreal Cognitive Assessment   Visuospatial/ Executive (0/5) 4 3  Naming (0/3) 3 3  Attention: Read list of digits (0/2) 2 2  Attention: Read list of letters (0/1) 1 1  Attention: Serial 7 subtraction starting at 100 (0/3) 3 3  Language: Repeat phrase (0/2) 2 1  Language : Fluency (0/1) 0 1  Abstraction (0/2) 2 2  Delayed Recall  (0/5) 0 0  Orientation (0/6) 5 5  Total 22 21  Adjusted Score (based on education) 22 21      CRANIAL NERVES: CN II: Visual fields are full to confrontation. Pupils are round equal and briskly reactive to light. CN III, IV, VI: extraocular movement are normal. No ptosis. CN V: Facial sensation is intact to light touch CN VII: Face is symmetric with normal eye closure  CN VIII: Hearing is normal to causal conversation. CN IX, X: Phonation is normal. CN XI: Head turning and shoulder shrug are intact  MOTOR: There is no pronator drift of out-stretched arms. Muscle bulk and tone are normal. Muscle strength is normal.  REFLEXES: Reflexes are 2+ and symmetric at the biceps, triceps, knees, and ankles. Plantar responses are flexor.  SENSORY: Intact to light touch, pinprick and vibratory sensation are intact in fingers and toes.  COORDINATION: There is no trunk or limb dysmetria noted.  GAIT/STANCE: Posture is normal. Gait is steady with normal steps, base, arm swing, and turning. Heel and toe walking are normal. Tandem gait is normal.  Romberg is absent.  REVIEW OF SYSTEMS:  Full 14 system review of systems performed and notable only for as above All other review of systems were negative.   ALLERGIES: Allergies  Allergen Reactions   Estrogens     Hair loss   Osphena [Ospemifene]     Headache, hot flashes    HOME MEDICATIONS: Current Outpatient Medications  Medication Sig Dispense Refill   acyclovir (ZOVIRAX) 200 MG capsule Take 200 mg by mouth daily.     atenolol (TENORMIN) 25 MG tablet Take 25 mg by mouth daily.     Biotin 96295 MCG TABS Take 1 tablet by mouth daily.     cholecalciferol (VITAMIN D3) 25 MCG (1000 UNIT) tablet Take 1,000 Units by mouth daily.     donepezil (ARICEPT) 10 MG tablet Take 1 tablet (10 mg total) by mouth at bedtime. 90 tablet 3   famotidine (PEPCID) 20 MG tablet Take 20 mg by mouth at bedtime.     Flaxseed, Linseed, (FLAX SEED OIL) 1000 MG  CAPS Take 1 capsule by mouth daily.     memantine (NAMENDA) 10 MG tablet Take 1 tablet (10 mg total) by mouth 2 (two) times daily. 180 tablet 3   Multiple Minerals-Vitamins (CITRACAL MAXIMUM PLUS) TABS Take 1 tablet by mouth daily.     Multiple Vitamin (MULTIVITAMIN) tablet Take 1 tablet by mouth daily.     traZODone (DESYREL) 50 MG tablet Take 1 tablet (50 mg total) by mouth at bedtime. 30 tablet 11   No current facility-administered medications for this visit.    PAST MEDICAL HISTORY: Past Medical History:  Diagnosis Date   Bulging lumbar disc    DJD (degenerative joint disease)    hand   Dry eyes    Essential tremor    Ganglion cyst    Herpes keratitis    HTN (hypertension)    Memory loss    Morton's neuroma    Osteopenia  Seasonal allergies     PAST SURGICAL HISTORY: Past Surgical History:  Procedure Laterality Date   No past surgery.      FAMILY HISTORY: Family History  Problem Relation Age of Onset   Hypertension Mother    Dementia Mother    Tremor Father    Heart disease Father     SOCIAL HISTORY: Social History   Socioeconomic History   Marital status: Married    Spouse name: Not on file   Number of children: 2   Years of education: college   Highest education level: Bachelor's degree (e.g., BA, AB, BS)  Occupational History   Occupation: Retired  Tobacco Use   Smoking status: Former    Types: Cigarettes   Smokeless tobacco: Never  Substance and Sexual Activity   Alcohol use: Yes    Comment: Hx of heavy alcohol use. For the last 3 months, only having one glass of wine with dinner.   Drug use: Never   Sexual activity: Not on file  Other Topics Concern   Not on file  Social History Narrative   Lives with husband.   Right-handed.   Two cups caffeine daily.   Social Determinants of Health   Financial Resource Strain: Not on file  Food Insecurity: No Food Insecurity (11/26/2021)   Received from [facility], [facility]   Hunger Vital  Sign    Worried About Running Out of Food in the Last Year: Never true    Ran Out of Food in the Last Year: Never true  Transportation Needs: Not on file  Physical Activity: Not on file  Stress: Not on file  Social Connections: Unknown (04/05/2022)   Received from [facility], [facility]   Social Network    Social Network: Not on file  Intimate Partner Violence: Unknown (02/26/2022)   Received from [facility], [facility]   HITS    Physically Hurt: Not on file    Insult or Talk Down To: Not on file    Threaten Physical Harm: Not on file    Scream or Curse: Not on file       Otila Kluver, DNP  [facility] [address], [address] [place], [place] [address] [phone]

## 2023-08-25 ENCOUNTER — Ambulatory Visit (INDEPENDENT_AMBULATORY_CARE_PROVIDER_SITE_OTHER): Payer: Medicare Other | Admitting: Neurology

## 2023-08-25 ENCOUNTER — Encounter: Payer: Self-pay | Admitting: Neurology

## 2023-08-25 VITALS — BP 149/72 | HR 74 | Ht 64.0 in | Wt 126.5 lb

## 2023-08-25 DIAGNOSIS — I679 Cerebrovascular disease, unspecified: Secondary | ICD-10-CM | POA: Diagnosis not present

## 2023-08-25 DIAGNOSIS — R4189 Other symptoms and signs involving cognitive functions and awareness: Secondary | ICD-10-CM | POA: Diagnosis not present

## 2023-08-25 MED ORDER — TRAZODONE HCL 50 MG PO TABS: 50.0000 mg | ORAL_TABLET | Freq: Every day | ORAL | 3 refills | Status: DC

## 2023-08-25 NOTE — Patient Instructions (Signed)
Great to meet you both today.  I will refill your trazodone.  Recommend management of vascular risk factors, healthy lifestyle, exercise for overall brain health.  Continue close follow-up with your primary care doctor.  Return here as needed.  Thanks

## 2023-09-10 ENCOUNTER — Ambulatory Visit: Payer: Medicare Other | Admitting: Adult Health

## 2023-11-05 DIAGNOSIS — B009 Herpesviral infection, unspecified: Secondary | ICD-10-CM | POA: Diagnosis not present

## 2023-12-24 DIAGNOSIS — F02A0 Dementia in other diseases classified elsewhere, mild, without behavioral disturbance, psychotic disturbance, mood disturbance, and anxiety: Secondary | ICD-10-CM | POA: Diagnosis not present

## 2023-12-24 DIAGNOSIS — Z0000 Encounter for general adult medical examination without abnormal findings: Secondary | ICD-10-CM | POA: Diagnosis not present

## 2023-12-24 DIAGNOSIS — E7800 Pure hypercholesterolemia, unspecified: Secondary | ICD-10-CM | POA: Diagnosis not present

## 2023-12-24 DIAGNOSIS — I10 Essential (primary) hypertension: Secondary | ICD-10-CM | POA: Diagnosis not present

## 2023-12-24 DIAGNOSIS — G301 Alzheimer's disease with late onset: Secondary | ICD-10-CM | POA: Diagnosis not present

## 2023-12-24 DIAGNOSIS — M8580 Other specified disorders of bone density and structure, unspecified site: Secondary | ICD-10-CM | POA: Diagnosis not present

## 2023-12-24 DIAGNOSIS — Z79899 Other long term (current) drug therapy: Secondary | ICD-10-CM | POA: Diagnosis not present

## 2023-12-24 DIAGNOSIS — M8589 Other specified disorders of bone density and structure, multiple sites: Secondary | ICD-10-CM | POA: Diagnosis not present

## 2023-12-24 DIAGNOSIS — Z1331 Encounter for screening for depression: Secondary | ICD-10-CM | POA: Diagnosis not present

## 2023-12-24 DIAGNOSIS — Z23 Encounter for immunization: Secondary | ICD-10-CM | POA: Diagnosis not present

## 2024-03-16 DIAGNOSIS — H524 Presbyopia: Secondary | ICD-10-CM | POA: Diagnosis not present

## 2024-03-17 DIAGNOSIS — L719 Rosacea, unspecified: Secondary | ICD-10-CM | POA: Diagnosis not present

## 2024-03-17 DIAGNOSIS — B351 Tinea unguium: Secondary | ICD-10-CM | POA: Diagnosis not present

## 2024-03-17 DIAGNOSIS — L821 Other seborrheic keratosis: Secondary | ICD-10-CM | POA: Diagnosis not present

## 2024-03-17 DIAGNOSIS — Z85828 Personal history of other malignant neoplasm of skin: Secondary | ICD-10-CM | POA: Diagnosis not present

## 2024-03-17 DIAGNOSIS — L570 Actinic keratosis: Secondary | ICD-10-CM | POA: Diagnosis not present

## 2024-03-17 DIAGNOSIS — D229 Melanocytic nevi, unspecified: Secondary | ICD-10-CM | POA: Diagnosis not present

## 2024-05-12 DIAGNOSIS — I10 Essential (primary) hypertension: Secondary | ICD-10-CM | POA: Diagnosis not present

## 2024-05-23 DIAGNOSIS — I10 Essential (primary) hypertension: Secondary | ICD-10-CM | POA: Diagnosis not present

## 2024-05-23 DIAGNOSIS — R079 Chest pain, unspecified: Secondary | ICD-10-CM | POA: Diagnosis not present

## 2024-05-23 DIAGNOSIS — M47816 Spondylosis without myelopathy or radiculopathy, lumbar region: Secondary | ICD-10-CM | POA: Diagnosis not present

## 2024-05-23 DIAGNOSIS — E7800 Pure hypercholesterolemia, unspecified: Secondary | ICD-10-CM | POA: Diagnosis not present

## 2024-05-23 DIAGNOSIS — G301 Alzheimer's disease with late onset: Secondary | ICD-10-CM | POA: Diagnosis not present

## 2024-05-23 DIAGNOSIS — R002 Palpitations: Secondary | ICD-10-CM | POA: Diagnosis not present

## 2024-06-02 DIAGNOSIS — R002 Palpitations: Secondary | ICD-10-CM | POA: Diagnosis not present

## 2024-06-07 DIAGNOSIS — R748 Abnormal levels of other serum enzymes: Secondary | ICD-10-CM | POA: Diagnosis not present

## 2024-06-10 DIAGNOSIS — I10 Essential (primary) hypertension: Secondary | ICD-10-CM | POA: Diagnosis not present

## 2024-06-14 DIAGNOSIS — G301 Alzheimer's disease with late onset: Secondary | ICD-10-CM | POA: Diagnosis not present

## 2024-06-14 DIAGNOSIS — R002 Palpitations: Secondary | ICD-10-CM | POA: Diagnosis not present

## 2024-06-14 DIAGNOSIS — F02A0 Dementia in other diseases classified elsewhere, mild, without behavioral disturbance, psychotic disturbance, mood disturbance, and anxiety: Secondary | ICD-10-CM | POA: Diagnosis not present

## 2024-06-14 DIAGNOSIS — I10 Essential (primary) hypertension: Secondary | ICD-10-CM | POA: Diagnosis not present

## 2024-06-23 DIAGNOSIS — G301 Alzheimer's disease with late onset: Secondary | ICD-10-CM | POA: Diagnosis not present

## 2024-06-23 DIAGNOSIS — I10 Essential (primary) hypertension: Secondary | ICD-10-CM | POA: Diagnosis not present

## 2024-06-23 DIAGNOSIS — M47816 Spondylosis without myelopathy or radiculopathy, lumbar region: Secondary | ICD-10-CM | POA: Diagnosis not present

## 2024-06-23 DIAGNOSIS — E7800 Pure hypercholesterolemia, unspecified: Secondary | ICD-10-CM | POA: Diagnosis not present

## 2024-07-04 DIAGNOSIS — R002 Palpitations: Secondary | ICD-10-CM | POA: Diagnosis not present

## 2024-07-10 DIAGNOSIS — I10 Essential (primary) hypertension: Secondary | ICD-10-CM | POA: Diagnosis not present

## 2024-07-19 ENCOUNTER — Other Ambulatory Visit: Payer: Self-pay | Admitting: Neurology

## 2024-07-24 DIAGNOSIS — I10 Essential (primary) hypertension: Secondary | ICD-10-CM | POA: Diagnosis not present

## 2024-07-24 DIAGNOSIS — G301 Alzheimer's disease with late onset: Secondary | ICD-10-CM | POA: Diagnosis not present

## 2024-07-24 DIAGNOSIS — M47816 Spondylosis without myelopathy or radiculopathy, lumbar region: Secondary | ICD-10-CM | POA: Diagnosis not present

## 2024-07-24 DIAGNOSIS — E7800 Pure hypercholesterolemia, unspecified: Secondary | ICD-10-CM | POA: Diagnosis not present

## 2024-08-09 DIAGNOSIS — I10 Essential (primary) hypertension: Secondary | ICD-10-CM | POA: Diagnosis not present

## 2024-08-23 DIAGNOSIS — M47816 Spondylosis without myelopathy or radiculopathy, lumbar region: Secondary | ICD-10-CM | POA: Diagnosis not present

## 2024-08-23 DIAGNOSIS — I10 Essential (primary) hypertension: Secondary | ICD-10-CM | POA: Diagnosis not present

## 2024-08-23 DIAGNOSIS — K080 Exfoliation of teeth due to systemic causes: Secondary | ICD-10-CM | POA: Diagnosis not present

## 2024-08-23 DIAGNOSIS — E7800 Pure hypercholesterolemia, unspecified: Secondary | ICD-10-CM | POA: Diagnosis not present

## 2024-08-23 DIAGNOSIS — G301 Alzheimer's disease with late onset: Secondary | ICD-10-CM | POA: Diagnosis not present

## 2024-09-08 DIAGNOSIS — I10 Essential (primary) hypertension: Secondary | ICD-10-CM | POA: Diagnosis not present

## 2024-09-23 DIAGNOSIS — E7800 Pure hypercholesterolemia, unspecified: Secondary | ICD-10-CM | POA: Diagnosis not present

## 2024-09-23 DIAGNOSIS — I10 Essential (primary) hypertension: Secondary | ICD-10-CM | POA: Diagnosis not present

## 2024-09-23 DIAGNOSIS — M47816 Spondylosis without myelopathy or radiculopathy, lumbar region: Secondary | ICD-10-CM | POA: Diagnosis not present

## 2024-09-23 DIAGNOSIS — G301 Alzheimer's disease with late onset: Secondary | ICD-10-CM | POA: Diagnosis not present

## 2024-10-08 DIAGNOSIS — I10 Essential (primary) hypertension: Secondary | ICD-10-CM | POA: Diagnosis not present

## 2024-10-23 DIAGNOSIS — G301 Alzheimer's disease with late onset: Secondary | ICD-10-CM | POA: Diagnosis not present

## 2024-10-23 DIAGNOSIS — M47816 Spondylosis without myelopathy or radiculopathy, lumbar region: Secondary | ICD-10-CM | POA: Diagnosis not present

## 2024-10-23 DIAGNOSIS — E7800 Pure hypercholesterolemia, unspecified: Secondary | ICD-10-CM | POA: Diagnosis not present

## 2024-10-23 DIAGNOSIS — I10 Essential (primary) hypertension: Secondary | ICD-10-CM | POA: Diagnosis not present
